# Patient Record
Sex: Male | Born: 1975 | Race: Black or African American | Hispanic: No | Marital: Married | Smoking: Never smoker
Health system: Southern US, Community
[De-identification: ages and names within clinical notes are randomized; demographics above are authoritative.]

---

## 2019-11-25 ENCOUNTER — Encounter: Payer: Self-pay | Admitting: Urology

## 2019-11-25 ENCOUNTER — Other Ambulatory Visit: Payer: Self-pay

## 2019-11-25 ENCOUNTER — Ambulatory Visit (INDEPENDENT_AMBULATORY_CARE_PROVIDER_SITE_OTHER): Payer: BC Managed Care – PPO | Admitting: Urology

## 2019-11-25 VITALS — BP 142/84 | HR 73 | Ht 73.0 in | Wt 176.6 lb

## 2019-11-25 DIAGNOSIS — R319 Hematuria, unspecified: Secondary | ICD-10-CM

## 2019-11-25 DIAGNOSIS — R3121 Asymptomatic microscopic hematuria: Secondary | ICD-10-CM

## 2019-11-25 NOTE — Progress Notes (Signed)
   11/25/19 11:17 AM   Alan Alan Vargas 1976-09-24 [id number]  Referring provider: Baxter Hire, MD [place],  [place] [address]  CC: Microscopic hematuria  HPI: I saw Alan Alan Vargas in urology clinic today for evaluation of microscopic hematuria.  On urinalysis 07/29/2019 and 10/15/2019 there were 4-10 RBCs per high-power field.  He also had an episode of 10-50 RBCs in March 2019.  Renal function is normal with a creatinine of 1.1, eGFR greater than 60.  He denies any history of gross hematuria.  He otherwise is very healthy and aside from hypertension has no other medical problems.  He denies any history of gross hematuria.  He specifically denies any urinary symptoms of dysuria or weak stream.  He is a never Alan Vargas and denies any other carcinogenic exposures.  There is no prior imaging to review.  He denies any family history of prostate, kidney, or bladder cancer.   PMH: Hypertension  Surgical History: No prior urologic surgeries  Allergies: No Known Allergies  Family History: No family history of urothelial cancer or prostate cancer  Social History:  has no history on file for tobacco, alcohol, and drug.  ROS: Please see flowsheet from today's date for complete review of systems.  Physical Exam: BP (!) 142/84 (BP Location: Left Arm, Patient Position: Sitting, Cuff Size: Normal)   Pulse 73   Ht 6' 1" (1.854 m)   Wt 176 lb 9.6 oz (80.1 kg)   BMI 23.30 kg/m    Constitutional:  Alert and oriented, No acute distress. Cardiovascular: No clubbing, cyanosis, or edema. Respiratory: Normal respiratory effort, no increased work of breathing. GI: Abdomen is soft, nontender, nondistended, no abdominal masses GU: No CVA tenderness Lymph: No cervical or inguinal lymphadenopathy. Skin: No rashes, bruises or suspicious lesions. Neurologic: Grossly intact, no focal deficits, moving all 4 extremities. Psychiatric: Normal mood and affect.  Laboratory  Data: Reviewed, see HPI  Pertinent Imaging: None to review  Assessment & Plan:   In summary, the patient is a very healthy 44 year old male with multiple episodes of asymptomatic microscopic hematuria, and no risk factors for urothelial cell carcinoma.  We discussed common possible etiologies of microscopic hematuria including idiopathic, urolithiasis, medical renal disease, and malignancy. We discussed the new asymptomatic microscopic hematuria guidelines and risk categories of low, intermediate, and high risk that are based on age, risk factors like smoking, and degree of microscopic hematuria. We discussed work-up can range from repeat urinalysis, renal ultrasound and cystoscopy, to CT urogram and cystoscopy.  He falls into the intermediate risk category, and I recommended proceeding with renal ultrasound and cystoscopy.  The patient would like to defer cystoscopy at this time, which is not unreasonable.  We discussed the low, but not zero, risk of missing a clinically significant cause of his microscopic hematuria.  I recommended pursuing cystoscopy if any abnormalities were found in the bladder on ultrasound, and he is in agreement.  Renal ultrasound, will call with results  A total of 30 minutes were spent face-to-face with the patient, greater than 50% was spent in patient education, counseling, and coordination of care regarding asymptomatic microscopic hematuria and work-up.   Billey Co, [facility] [address], [place], [place] [address] [phone]

## 2019-11-27 LAB — URINALYSIS, COMPLETE
Bilirubin, UA: NEGATIVE
Glucose, UA: NEGATIVE
Ketones, UA: NEGATIVE
Leukocytes,UA: NEGATIVE
Nitrite, UA: NEGATIVE
Protein,UA: NEGATIVE
Specific Gravity, UA: 1.02 (ref 1.005–1.030)
Urobilinogen, Ur: 0.2 mg/dL (ref 0.2–1.0)
pH, UA: 8.5 — ABNORMAL HIGH (ref 5.0–7.5)

## 2019-11-27 LAB — MICROSCOPIC EXAMINATION
Bacteria, UA: NONE SEEN
WBC, UA: NONE SEEN /hpf (ref 0–5)

## 2019-12-10 ENCOUNTER — Ambulatory Visit
Admission: RE | Admit: 2019-12-10 | Discharge: 2019-12-10 | Disposition: A | Discharge status: Home / Self Care | Payer: BC Managed Care – PPO | Source: Ambulatory Visit | Attending: Urology | Admitting: Urology

## 2019-12-10 ENCOUNTER — Other Ambulatory Visit: Payer: Self-pay

## 2019-12-10 DIAGNOSIS — R319 Hematuria, unspecified: Secondary | ICD-10-CM | POA: Diagnosis present

## 2019-12-11 ENCOUNTER — Telehealth: Payer: Self-pay

## 2019-12-11 NOTE — Telephone Encounter (Signed)
-----   Message from Sondra Come, MD sent at 12/11/2019 11:22 AM EST ----- Kidney ultrasound is totally normal, follow-up as needed  Legrand Rams, MD 12/11/2019

## 2021-03-07 ENCOUNTER — Ambulatory Visit: Payer: BC Managed Care – PPO | Admitting: Internal Medicine

## 2021-03-07 ENCOUNTER — Other Ambulatory Visit: Payer: Self-pay

## 2021-05-30 ENCOUNTER — Other Ambulatory Visit: Payer: Self-pay | Admitting: Physician Assistant

## 2021-05-30 DIAGNOSIS — G8929 Other chronic pain: Secondary | ICD-10-CM

## 2021-06-09 ENCOUNTER — Ambulatory Visit: Payer: BC Managed Care – PPO

## 2021-06-13 ENCOUNTER — Ambulatory Visit
Admission: RE | Admit: 2021-06-13 | Discharge: 2021-06-13 | Disposition: A | Discharge status: Home / Self Care | Payer: BC Managed Care – PPO | Source: Ambulatory Visit | Attending: Physician Assistant | Admitting: Physician Assistant

## 2021-06-13 ENCOUNTER — Other Ambulatory Visit: Payer: Self-pay

## 2021-06-13 DIAGNOSIS — G8929 Other chronic pain: Secondary | ICD-10-CM | POA: Diagnosis present

## 2021-06-13 DIAGNOSIS — M25532 Pain in left wrist: Secondary | ICD-10-CM | POA: Insufficient documentation

## 2021-09-27 IMAGING — MR MR WRIST*L* W/O CM
6 series · 40 of 40 positions shown · non-contrast
Comparison: None.

CLINICAL DATA: Chronic left wrist pain

EXAM:
MR OF THE LEFT WRIST WITHOUT CONTRAST
TECHNIQUE: Multiplanar, multisequence MR imaging of the left wrist was
performed. No intravenous contrast was administered.

[Series 3: T2 fat-sat · axial · left · 2.0mm · 0.47mm/px · z∈[-5,+49]mm · 9 of 25 slices shown (1 of 2)]
[im 1/25]
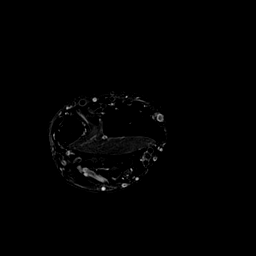
[im 4/25]
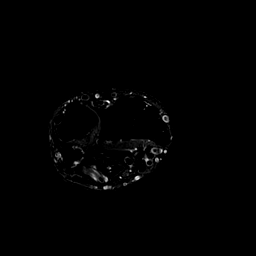
[im 7/25]
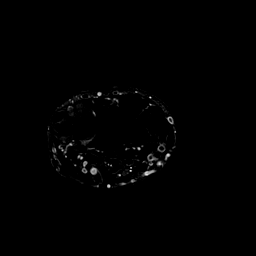
[im 10/25]
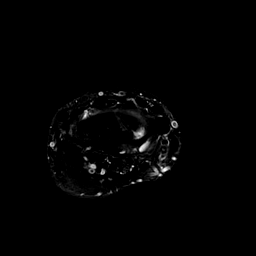
[im 13/25]
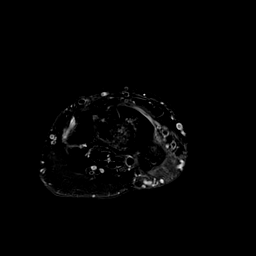
[im 16/25]
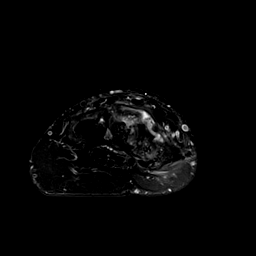
[im 19/25]
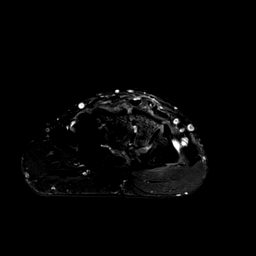
[im 22/25]
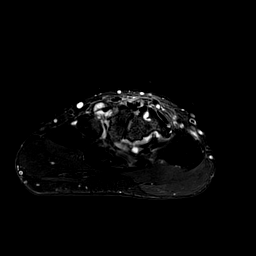
[im 25/25]
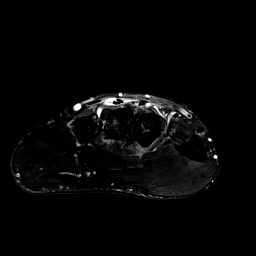

[Series 4: T1 · axial · left · 2.0mm · 0.47mm/px · z∈[-5,+49]mm · 8 of 25 slices shown (1 of 2)]
[im 1/25]
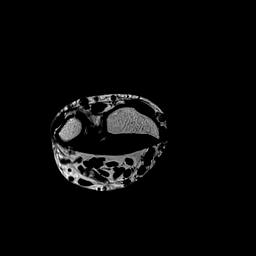
[im 4/25]
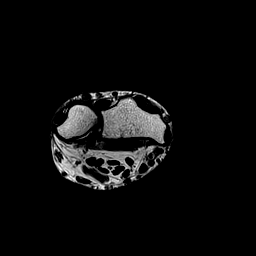
[im 7/25]
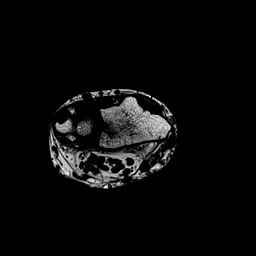
[im 11/25]
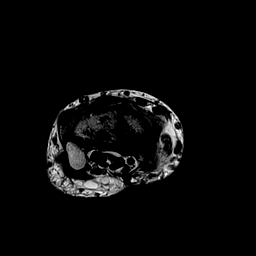
[im 14/25]
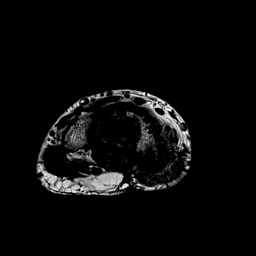
[im 18/25]
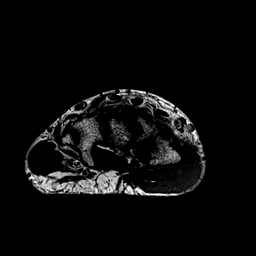
[im 21/25]
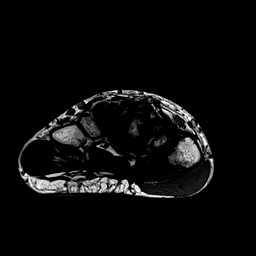
[im 25/25]
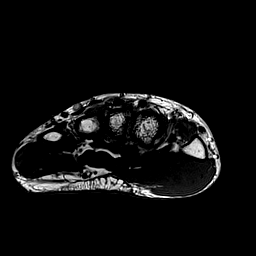

[Series 5: T1 · coronal · left · 3.0mm · 0.39mm/px · 5 of 15 slices shown (2 of 2)]
[im 1/15]
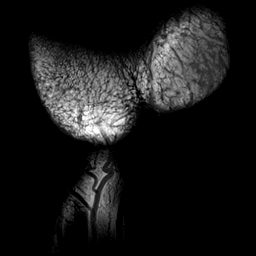
[im 4/15]
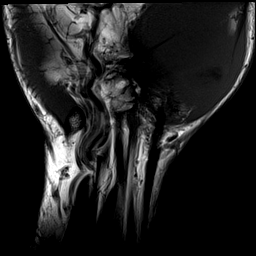
[im 8/15]
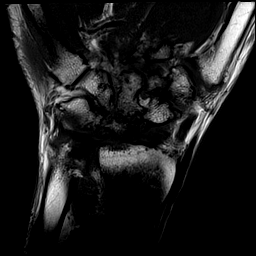
[im 11/15]
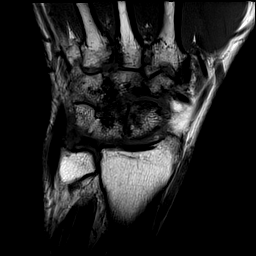
[im 15/15]
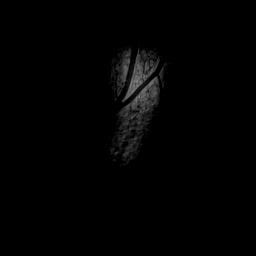

[Series 6: T2 fat-sat · coronal · left · 3.0mm · 0.39mm/px · 4 of 13 slices shown (2 of 2)]
[im 1/13]
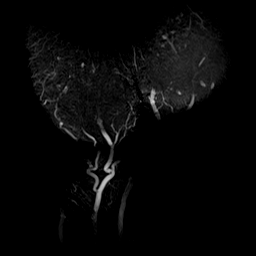
[im 5/13]
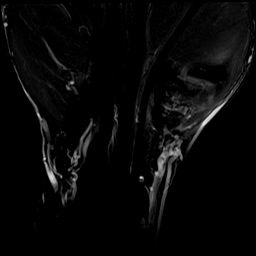
[im 9/13]
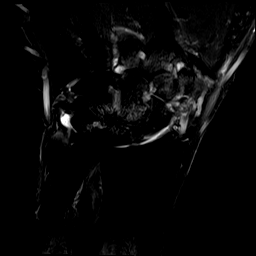
[im 13/13]
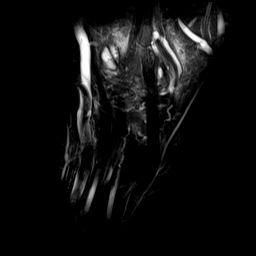

[Series 7: PD fat-sat · coronal · left · 3.0mm · 0.39mm/px · 5 of 14 slices shown (1 of 2)]
[im 1/14]
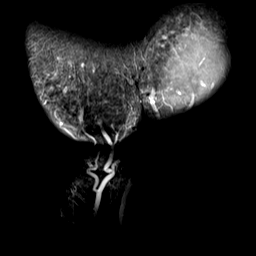
[im 4/14]
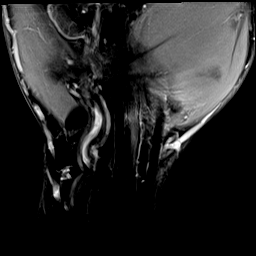
[im 7/14]
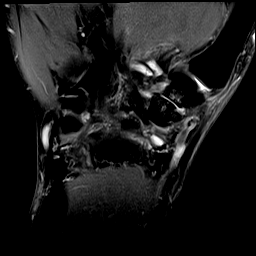
[im 10/14]
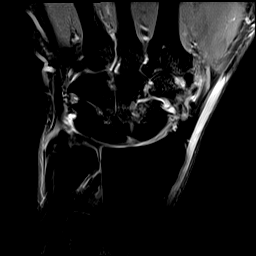
[im 14/14]
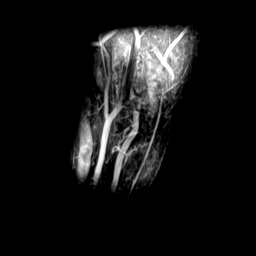

[Series 8: PD fat-sat · sagittal · left · 3.0mm · 0.39mm/px · 9 of 28 slices shown (2 of 2)]
[im 1/28]
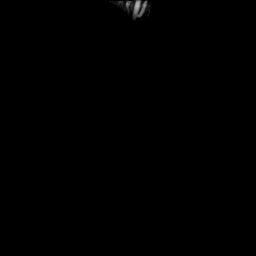
[im 4/28]
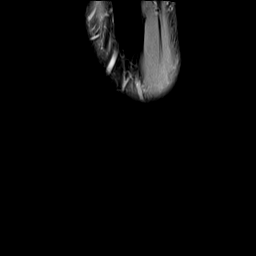
[im 7/28]
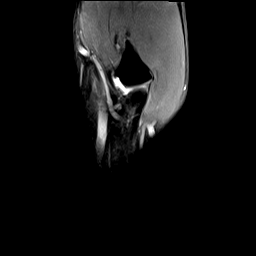
[im 11/28]
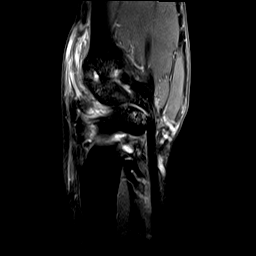
[im 14/28]
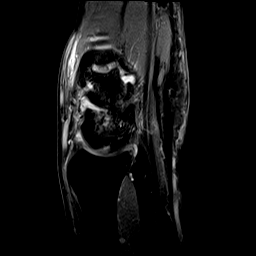
[im 17/28]
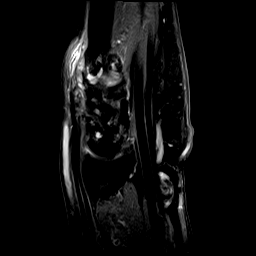
[im 21/28]
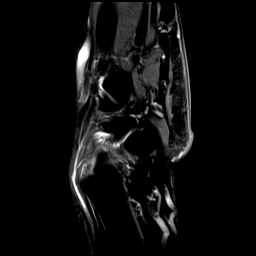
[im 24/28]
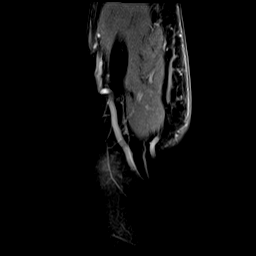
[im 28/28]
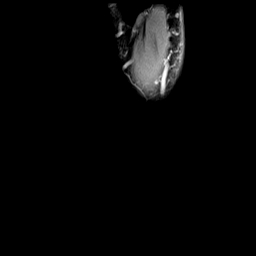

[40 of 40 positions shown; findings below may reference images not displayed]

FINDINGS: Ligaments: The scapholunate ligament is poorly visualized. There are
triquetral ligament is not present due to lunotriquetral coalition.

Triangular fibrocartilage: Intact

Tendons: Flexor and extensor compartment tendons are intact without
tendinosis, tear or tenosynovitis.

Carpal tunnel/median nerve: Flexor retinaculum is intact. Normal
carpal tunnel without a mass. Median nerve demonstrates normal
signal and caliber.

Guyon's canal: Normal Guyon's canal. Normal ulnar nerve.

Joint/cartilage: Severe intercarpal arthritis with effusion and
synovitis. Diffuse cartilage thinning. There is also mild to
moderate second, third, and fourth carpometacarpal joint arthritis.
Small erosions are possible.

Bones/carpal alignment: Lunotriquetral coalition. Normal carpal
alignment. No evidence of acute fracture.

Other: Muscles are normal. No fluid collection, hematoma, or soft
tissue mass.
IMPRESSION: Severe intercarpal and mild to moderate second-fourth
carpometacarpal joint arthritis with bony edema and cystic
change/possible small erosions. Effusion and mild synovitis
throughout the wrist. Inflammatory arthritis such as rheumatoid can
have this appearance. Recommend wrist radiographs for correlation.

Lunotriquetral coalition. The scapholunate ligament is poorly
visualized and possibly degenerated/torn.
